# Patient Record
Sex: Female | Born: 2005 | Race: Black or African American | Hispanic: No | Marital: Single | Smoking: Never smoker
Health system: Southern US, Community
[De-identification: ages and names within clinical notes are randomized; demographics above are authoritative.]

## PROBLEM LIST (undated history)

## (undated) DIAGNOSIS — T7840XA Allergy, unspecified, initial encounter: Secondary | ICD-10-CM

## (undated) DIAGNOSIS — G002 Streptococcal meningitis: Principal | ICD-10-CM

## (undated) DIAGNOSIS — E079 Disorder of thyroid, unspecified: Secondary | ICD-10-CM

## (undated) DIAGNOSIS — G009 Bacterial meningitis, unspecified: Secondary | ICD-10-CM

## (undated) HISTORY — PX: BRAIN SURGERY: SHX531

## (undated) HISTORY — DX: Allergy, unspecified, initial encounter: T78.40XA

## (undated) HISTORY — DX: Streptococcal meningitis: G00.2

## (undated) HISTORY — PX: CRANIOTOMY: SHX93

---
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
## 2023-01-04 NOTE — Progress Notes (Deleted)
    SUBJECTIVE:   CHIEF COMPLAINT / HPI:   Sore throat  *HIV, Hep C testing  PERTINENT  PMH / PSH: ***  OBJECTIVE:   There were no vitals taken for this visit. ***  General: NAD, pleasant, able to participate in exam Cardiac: RRR, no murmurs. Respiratory: CTAB, normal effort, No wheezes, rales or rhonchi Abdomen: Bowel sounds present, nontender, nondistended Extremities: no edema or cyanosis. Skin: warm and dry, no rashes noted Neuro: alert, no obvious focal deficits Psych: Normal affect and mood  ASSESSMENT/PLAN:   No problem-specific Assessment & Plan notes found for this encounter.     Dr. Elberta Fortis, DO [facility]    {    This will disappear when note is signed, click to select method of visit    :1}

## 2023-01-05 ENCOUNTER — Ambulatory Visit: Payer: Self-pay

## 2023-01-06 ENCOUNTER — Ambulatory Visit: Payer: Medicaid Other | Admitting: Family Medicine

## 2023-11-10 ENCOUNTER — Ambulatory Visit: Admitting: Family Medicine

## 2023-11-13 ENCOUNTER — Encounter: Payer: Self-pay | Admitting: Family Medicine

## 2023-11-13 ENCOUNTER — Ambulatory Visit (INDEPENDENT_AMBULATORY_CARE_PROVIDER_SITE_OTHER): Admitting: Family Medicine

## 2023-11-13 VITALS — BP 112/70 | HR 92 | Ht 65.5 in | Wt 184.8 lb

## 2023-11-13 DIAGNOSIS — Z309 Encounter for contraceptive management, unspecified: Secondary | ICD-10-CM | POA: Diagnosis not present

## 2023-11-13 DIAGNOSIS — B360 Pityriasis versicolor: Secondary | ICD-10-CM | POA: Diagnosis not present

## 2023-11-13 DIAGNOSIS — L700 Acne vulgaris: Secondary | ICD-10-CM

## 2023-11-13 LAB — POCT URINE PREGNANCY: Preg Test, Ur: NEGATIVE

## 2023-11-13 MED ORDER — KETOCONAZOLE 2 % EX CREA: 1.0000 | TOPICAL_CREAM | Freq: Every day | CUTANEOUS | 0 refills | Status: AC

## 2023-11-13 MED ORDER — NORGESTIMATE-ETH ESTRADIOL 0.25-35 MG-MCG PO TABS: 1.0000 | ORAL_TABLET | Freq: Every day | ORAL | 11 refills | Status: AC

## 2023-11-13 NOTE — Progress Notes (Signed)
    SUBJECTIVE:   CHIEF COMPLAINT / HPI:   Right leg rash- 2 weeks, not itching, painful, bleeding, no history of trauma, not anywhere else.  Birth control- would like to discuss. Has been sexually active one time, used condoms, was three weeks, feels safe in that relationship. Had period since then, LMP 10/31/23. Not planning on being sexually active again for a while but wants to be on contraception just in case. Not taking daily PNV. Denies history ogf VTE, denies history of migraine with aura. Does not smoke.   Acne- uses Cerevae, wondering if there is something else she can take.   STD testing- declines. Got free swabs at college that were negative, did not do blood work. Does not want today.   PERTINENT  PMH / PSH: acne  OBJECTIVE:   BP 112/70   Pulse 92   Ht 5' 5.5 (1.664 m)   Wt 184 lb 12.8 oz (83.8 kg)   LMP 10/31/2023   SpO2 100%   BMI 30.28 kg/m   General: A&O, NAD HEENT: No sign of trauma, EOM grossly intact Cardiac: RRR, no m/r/g Respiratory: CTAB, normal WOB, no w/c/r GI: Soft, NTTP, non-distended  Extremities: NTTP, no peripheral edema. Skin: acne on nose and chin with mild scarring, no comedones, on right leg, scattered patches of scaling and pinkish coloration, blanchable, no petechiae or vesicles Neuro: Normal gait, moves all four extremities appropriately. Psych: Appropriate mood and affect   ASSESSMENT/PLAN:   Assessment & Plan Encounter for contraceptive management, unspecified type Discussed all contraceptive options in detail, she elects for Sprintec OCPs. Discussed using timer for daily reminder on her phone. Upreg negative today, had LMP since last sexual activity. Declines STI testing today, had negative swabs at campus [facility]. Was also interested in and considering Nexplanon, discussed benefits of reliable contraception and not having to remember daily and lasting for 3 years. Discussed risks including infection, bruising, migration, irregular  bleeding, and answered all questions and concerns. She will make appt in future if she would like to get Nexplanon. Tinea versicolor Ketoconazole cream daily x2 weeks, let us  know if not improving Acne vulgaris Topical retinoid qPM, continue face moisturizer daily     Charmel Cooter, MD [facility]

## 2023-11-13 NOTE — Assessment & Plan Note (Signed)
 Topical retinoid qPM, continue face moisturizer daily

## 2023-11-13 NOTE — Patient Instructions (Addendum)
 It was wonderful to see you today.  Please bring ALL of your medications with you to every visit.   Today we talked about:  For your rash, this looks like a fungal infection called tinea versicolor. You can use the ketaconazole cream on infected areas once a day for two weeks, and let us  know if it is not improved.  For acne- you can use Cerevae daily AM/PM. You can add the medicated Tretenoin (RETIN-A ) once at nighttime and then use your PM face lotion. If you skin is dryer you can do it every other day.  You can take a daily multivitamin/prenatal vitamins- look for ones that have 0.4 mg of folic acid in them.  You can make a follow up apointment with our nurses to get your meningitis vaccine.   If you decide you want the nexplanon or other forms of birth control please make a follow up appointment.   Sent the Sprintec in for your today.   Thank you for choosing [facility].   Please call [phone] with any questions about today's appointment.  Please arrive at least 15 minutes prior to your scheduled appointments.   If you had blood work today, I will send you a MyChart message or a letter if results are normal. Otherwise, I will give you a call.   If you had a referral placed, they will call you to set up an appointment. Please give us  a call if you don't hear back in the next 2 weeks.   If you need additional refills before your next appointment, please call your pharmacy first.   Avanell Bob, MD  Family Medicine

## 2023-11-16 ENCOUNTER — Other Ambulatory Visit: Payer: Self-pay | Admitting: Family Medicine

## 2023-11-16 ENCOUNTER — Telehealth: Payer: Self-pay | Admitting: Family Medicine

## 2023-11-16 DIAGNOSIS — L700 Acne vulgaris: Secondary | ICD-10-CM

## 2023-11-16 MED ORDER — TRETINOIN 0.025 % EX CREA: TOPICAL_CREAM | Freq: Every day | CUTANEOUS | 0 refills | Status: AC

## 2023-11-16 NOTE — Telephone Encounter (Signed)
 Patient came in asking for Tretenoin to be sent in for her please. States it was discussed during her last visit, but was not sent in.

## 2023-11-16 NOTE — Progress Notes (Signed)
Retin-A sent to pharmacy
